# Patient Record
Sex: Female | Born: 1960 | Race: Black or African American | Hispanic: No | Marital: Married | Smoking: Never smoker
Health system: Southern US, Community
[De-identification: ages and names within clinical notes are randomized; demographics above are authoritative.]

## PROBLEM LIST (undated history)

## (undated) DIAGNOSIS — R809 Proteinuria, unspecified: Secondary | ICD-10-CM

## (undated) HISTORY — PX: SALPINGECTOMY: SHX328

## (undated) HISTORY — PX: KIDNEY SURGERY: SHX687

## (undated) HISTORY — PX: ANKLE FRACTURE SURGERY: SHX122

---

## 1999-07-26 ENCOUNTER — Encounter: Payer: Self-pay | Admitting: Family Medicine

## 1999-07-26 ENCOUNTER — Ambulatory Visit (HOSPITAL_COMMUNITY): Admission: RE | Admit: 1999-07-26 | Discharge: 1999-07-26 | Payer: Self-pay | Admitting: *Deleted

## 2000-08-14 ENCOUNTER — Other Ambulatory Visit: Admission: RE | Admit: 2000-08-14 | Discharge: 2000-08-14 | Payer: Self-pay | Admitting: Family Medicine

## 2003-04-08 ENCOUNTER — Encounter: Payer: Self-pay | Admitting: Family Medicine

## 2003-04-08 ENCOUNTER — Ambulatory Visit (HOSPITAL_COMMUNITY): Admission: RE | Admit: 2003-04-08 | Discharge: 2003-04-08 | Payer: Self-pay | Admitting: Family Medicine

## 2004-09-07 ENCOUNTER — Other Ambulatory Visit: Admission: RE | Admit: 2004-09-07 | Discharge: 2004-09-07 | Payer: Self-pay | Admitting: Family Medicine

## 2004-09-23 ENCOUNTER — Ambulatory Visit (HOSPITAL_COMMUNITY): Admission: RE | Admit: 2004-09-23 | Discharge: 2004-09-23 | Payer: Self-pay | Admitting: Family Medicine

## 2008-05-28 ENCOUNTER — Other Ambulatory Visit: Admission: RE | Admit: 2008-05-28 | Discharge: 2008-05-28 | Payer: Self-pay | Admitting: Family Medicine

## 2009-01-29 ENCOUNTER — Encounter: Admission: RE | Admit: 2009-01-29 | Discharge: 2009-01-29 | Payer: Self-pay | Admitting: Cardiology

## 2009-03-17 ENCOUNTER — Encounter: Admission: RE | Admit: 2009-03-17 | Discharge: 2009-03-17 | Payer: Self-pay | Admitting: Family Medicine

## 2011-04-25 ENCOUNTER — Other Ambulatory Visit (HOSPITAL_COMMUNITY)
Admission: RE | Admit: 2011-04-25 | Discharge: 2011-04-25 | Disposition: A | Discharge status: Home / Self Care | Payer: BC Managed Care – PPO | Source: Ambulatory Visit | Attending: Family Medicine | Admitting: Family Medicine

## 2011-04-25 ENCOUNTER — Other Ambulatory Visit: Payer: Self-pay | Admitting: Family Medicine

## 2011-04-25 DIAGNOSIS — Z0000 Encounter for general adult medical examination without abnormal findings: Secondary | ICD-10-CM | POA: Insufficient documentation

## 2011-04-26 ENCOUNTER — Other Ambulatory Visit: Payer: Self-pay | Admitting: Nephrology

## 2011-04-26 DIAGNOSIS — R809 Proteinuria, unspecified: Secondary | ICD-10-CM

## 2011-04-27 ENCOUNTER — Ambulatory Visit
Admission: RE | Admit: 2011-04-27 | Discharge: 2011-04-27 | Disposition: A | Discharge status: Home / Self Care | Payer: BC Managed Care – PPO | Source: Ambulatory Visit | Attending: Nephrology | Admitting: Nephrology

## 2011-04-27 DIAGNOSIS — R809 Proteinuria, unspecified: Secondary | ICD-10-CM

## 2011-04-28 ENCOUNTER — Other Ambulatory Visit: Payer: Self-pay | Admitting: Family Medicine

## 2011-04-28 DIAGNOSIS — R1310 Dysphagia, unspecified: Secondary | ICD-10-CM

## 2011-05-02 ENCOUNTER — Ambulatory Visit
Admission: RE | Admit: 2011-05-02 | Discharge: 2011-05-02 | Disposition: A | Discharge status: Home / Self Care | Payer: BC Managed Care – PPO | Source: Ambulatory Visit | Attending: Family Medicine | Admitting: Family Medicine

## 2011-05-02 DIAGNOSIS — R1310 Dysphagia, unspecified: Secondary | ICD-10-CM

## 2011-11-22 ENCOUNTER — Other Ambulatory Visit (HOSPITAL_COMMUNITY): Payer: Self-pay | Admitting: Otolaryngology

## 2011-11-24 ENCOUNTER — Ambulatory Visit (HOSPITAL_COMMUNITY)
Admission: RE | Admit: 2011-11-24 | Discharge: 2011-11-24 | Disposition: A | Discharge status: Home / Self Care | Payer: BC Managed Care – PPO | Source: Ambulatory Visit | Attending: Otolaryngology | Admitting: Otolaryngology

## 2011-11-24 DIAGNOSIS — R1310 Dysphagia, unspecified: Secondary | ICD-10-CM | POA: Insufficient documentation

## 2011-11-24 NOTE — Procedures (Signed)
Modified Barium Swallow Procedure Note Patient Details  Name: Madeline Patterson MRN: [id number] Date of Birth: 1961/01/07  Today's Date: 11/24/2011 Time:  -     Past Medical History: No past medical history on file. Past Surgical History: No past surgical history on file. HPI:     Recommendation/Prognosis  Clinical Impression Dysphagia Diagnosis: Within Functional Limits Clinical impression: Pt presents with oral and oropharyngeal swallow WFL.  No penetration, aspiraiton or residue observed.  Pt with appearance of mildly slow esophageal transit of solids and liquids.  No backflow to pharynx observed.  Pt to have barium swallow today to further address esophageal function.  Pt may continue a regular thin diet, no further recommendations, will defer to referring MD.  Recommendations Solid Consistency: Regular Liquid Consistency: Thin Liquid Administration via: Straw;Cup Medication Administration: Whole meds with liquid Supervision: Patient able to self feed Postural Changes and/or Swallow Maneuvers: Seated upright 90 degrees;Upright 30-60 min after meal Oral Care Recommendations: Patient independent with oral care   Individuals Consulted Consulted and Agree with Results and Recommendations: Patient Report Sent to : Referring physician   General:  Date of Onset: 11/23/10 Other Pertinent Information: Pt reports gagging, desire to expectorate, globus, burning sensation in throat with POs and in bed.  Has been treated by ENT, has tried medications for acid reflux but reports "they stop working after a few weeks so i stop taking them." Pt denies other medical problems.  Type of Study: Initial MBS Diet Prior to this Study: Regular;Thin liquids Respiratory Status: Room air Behavior/Cognition: Alert;Cooperative;Pleasant mood Oral Cavity - Dentition: Adequate natural dentition Oral Motor / Sensory Function: Within functional limits Patient Positioning: Upright in chair Baseline Vocal  Quality: Normal Volitional Cough: Strong Volitional Swallow: Able to elicit Anatomy: Within functional limits Pharyngeal Secretions: Not observed secondary MBS Ice chips: Not tested   Oral Phase Oral Preparation/Oral Phase Oral Phase: WFL Pharyngeal Phase  Pharyngeal Phase Pharyngeal Phase: Within functional limits Cervical Esophageal Phase  Cervical Esophageal Phase Cervical Esophageal Phase: Impaired Cervical Esophageal Phase - Thin Thin Cup: Other (Comment) (Appearance of slow transit of thin liquids through LES) Cervical Esophageal Phase - Solids Puree:  (Appearance of stasis throughout esophagus for 10 seconds )   Harlon Ditty, MA CCC-SLP [phone]   Claudine Mouton 11/24/2011, 2:09 PM

## 2013-04-30 ENCOUNTER — Other Ambulatory Visit: Payer: Self-pay | Admitting: Family Medicine

## 2013-04-30 ENCOUNTER — Other Ambulatory Visit (HOSPITAL_COMMUNITY)
Admission: RE | Admit: 2013-04-30 | Discharge: 2013-04-30 | Disposition: A | Discharge status: Home / Self Care | Payer: BC Managed Care – PPO | Source: Ambulatory Visit | Attending: Family Medicine | Admitting: Family Medicine

## 2013-04-30 DIAGNOSIS — Z0000 Encounter for general adult medical examination without abnormal findings: Secondary | ICD-10-CM | POA: Insufficient documentation

## 2014-10-24 ENCOUNTER — Other Ambulatory Visit: Payer: Self-pay | Admitting: Otolaryngology

## 2014-10-24 ENCOUNTER — Ambulatory Visit
Admission: RE | Admit: 2014-10-24 | Discharge: 2014-10-24 | Disposition: A | Discharge status: Home / Self Care | Payer: BC Managed Care – PPO | Source: Ambulatory Visit | Attending: Otolaryngology | Admitting: Otolaryngology

## 2014-10-24 DIAGNOSIS — J019 Acute sinusitis, unspecified: Secondary | ICD-10-CM

## 2015-05-04 ENCOUNTER — Other Ambulatory Visit: Payer: Self-pay | Admitting: Internal Medicine

## 2016-06-06 ENCOUNTER — Other Ambulatory Visit (HOSPITAL_COMMUNITY): Payer: Self-pay | Admitting: Family

## 2016-06-09 ENCOUNTER — Encounter (HOSPITAL_COMMUNITY): Payer: Self-pay | Admitting: *Deleted

## 2016-06-09 MED ORDER — CEFAZOLIN SODIUM-DEXTROSE 2-4 GM/100ML-% IV SOLN
2.0000 g | INTRAVENOUS | Status: AC
  Administered 2016-06-10: 2 g via INTRAVENOUS
  Filled 2016-06-09: qty 100

## 2016-06-10 ENCOUNTER — Encounter (HOSPITAL_COMMUNITY): Payer: Self-pay | Admitting: *Deleted

## 2016-06-10 ENCOUNTER — Ambulatory Visit (HOSPITAL_COMMUNITY): Payer: BC Managed Care – PPO | Admitting: Certified Registered Nurse Anesthetist

## 2016-06-10 ENCOUNTER — Ambulatory Visit (HOSPITAL_COMMUNITY)
Admission: RE | Admit: 2016-06-10 | Discharge: 2016-06-10 | Disposition: A | Discharge status: Home / Self Care | Payer: BC Managed Care – PPO | Source: Ambulatory Visit | Attending: Orthopedic Surgery | Admitting: Orthopedic Surgery

## 2016-06-10 ENCOUNTER — Encounter (HOSPITAL_COMMUNITY)
Admission: RE | Disposition: A | Discharge status: Home / Self Care | Payer: Self-pay | Source: Ambulatory Visit | Attending: Orthopedic Surgery

## 2016-06-10 DIAGNOSIS — S92352A Displaced fracture of fifth metatarsal bone, left foot, initial encounter for closed fracture: Secondary | ICD-10-CM | POA: Diagnosis present

## 2016-06-10 DIAGNOSIS — Z6841 Body Mass Index (BMI) 40.0 and over, adult: Secondary | ICD-10-CM | POA: Insufficient documentation

## 2016-06-10 DIAGNOSIS — X58XXXA Exposure to other specified factors, initial encounter: Secondary | ICD-10-CM | POA: Diagnosis not present

## 2016-06-10 DIAGNOSIS — S92302S Fracture of unspecified metatarsal bone(s), left foot, sequela: Secondary | ICD-10-CM

## 2016-06-10 HISTORY — DX: Proteinuria, unspecified: R80.9

## 2016-06-10 HISTORY — PX: ORIF TOE FRACTURE: SHX5032

## 2016-06-10 LAB — BASIC METABOLIC PANEL
ANION GAP: 5 (ref 5–15)
BUN: 17 mg/dL (ref 6–20)
CHLORIDE: 107 mmol/L (ref 101–111)
CO2: 26 mmol/L (ref 22–32)
Calcium: 9.1 mg/dL (ref 8.9–10.3)
Creatinine, Ser: 0.91 mg/dL (ref 0.44–1.00)
GFR calc Af Amer: >60 mL/min (ref >60)
GLUCOSE: 91 mg/dL (ref 65–99)
POTASSIUM: 4.1 mmol/L (ref 3.5–5.1)
Sodium: 138 mmol/L (ref 135–145)

## 2016-06-10 LAB — CBC
HCT: 39.6 % (ref 36.0–46.0)
HEMOGLOBIN: 12.3 g/dL (ref 12.0–15.0)
MCH: 25.3 pg — AB (ref 26.0–34.0)
MCHC: 31.1 g/dL (ref 30.0–36.0)
MCV: 81.3 fL (ref 78.0–100.0)
Platelets: 228 10*3/uL (ref 150–400)
RBC: 4.87 MIL/uL (ref 3.87–5.11)
RDW: 13.3 % (ref 11.5–15.5)
WBC: 2.9 10*3/uL — AB (ref 4.0–10.5)

## 2016-06-10 SURGERY — OPEN REDUCTION INTERNAL FIXATION (ORIF) METATARSAL (TOE) FRACTURE
Anesthesia: General | Laterality: Left

## 2016-06-10 MED ORDER — KETOROLAC TROMETHAMINE 30 MG/ML IJ SOLN
INTRAMUSCULAR | Status: AC
  Filled 2016-06-10: qty 1

## 2016-06-10 MED ORDER — HYDROCODONE-ACETAMINOPHEN 5-325 MG PO TABS: 1.0000 | ORAL_TABLET | Freq: Four times a day (QID) | ORAL | Status: AC | PRN

## 2016-06-10 MED ORDER — FENTANYL CITRATE (PF) 100 MCG/2ML IJ SOLN
INTRAMUSCULAR | Status: DC | PRN
  Administered 2016-06-10 (×2): 50 ug via INTRAVENOUS

## 2016-06-10 MED ORDER — ONDANSETRON HCL 4 MG/2ML IJ SOLN: 4.0000 mg | Freq: Once | INTRAMUSCULAR | Status: DC | PRN

## 2016-06-10 MED ORDER — KETOROLAC TROMETHAMINE 30 MG/ML IJ SOLN
30.0000 mg | Freq: Once | INTRAMUSCULAR | Status: AC
  Administered 2016-06-10: 30 mg via INTRAVENOUS

## 2016-06-10 MED ORDER — FENTANYL CITRATE (PF) 100 MCG/2ML IJ SOLN
25.0000 ug | INTRAMUSCULAR | Status: DC | PRN
  Administered 2016-06-10 (×2): 50 ug via INTRAVENOUS

## 2016-06-10 MED ORDER — MEPERIDINE HCL 25 MG/ML IJ SOLN: 6.2500 mg | INTRAMUSCULAR | Status: DC | PRN

## 2016-06-10 MED ORDER — IBUPROFEN 200 MG PO TABS
200.0000 mg | ORAL_TABLET | Freq: Four times a day (QID) | ORAL | Status: DC | PRN
  Filled 2016-06-10: qty 2

## 2016-06-10 MED ORDER — LIDOCAINE HCL (CARDIAC) 20 MG/ML IV SOLN
INTRAVENOUS | Status: DC | PRN
  Administered 2016-06-10: 100 mg via INTRAVENOUS

## 2016-06-10 MED ORDER — PROPOFOL 10 MG/ML IV BOLUS
INTRAVENOUS | Status: AC
  Filled 2016-06-10: qty 20

## 2016-06-10 MED ORDER — LACTATED RINGERS IV SOLN
INTRAVENOUS | Status: DC
  Administered 2016-06-10 (×2): via INTRAVENOUS

## 2016-06-10 MED ORDER — MIDAZOLAM HCL 5 MG/5ML IJ SOLN
INTRAMUSCULAR | Status: DC | PRN
  Administered 2016-06-10: 2 mg via INTRAVENOUS

## 2016-06-10 MED ORDER — FENTANYL CITRATE (PF) 250 MCG/5ML IJ SOLN
INTRAMUSCULAR | Status: AC
  Filled 2016-06-10: qty 5

## 2016-06-10 MED ORDER — PROPOFOL 10 MG/ML IV BOLUS
INTRAVENOUS | Status: DC | PRN
  Administered 2016-06-10: 40 mg via INTRAVENOUS
  Administered 2016-06-10: 200 mg via INTRAVENOUS

## 2016-06-10 MED ORDER — OXYCODONE HCL 5 MG/5ML PO SOLN
5.0000 mg | Freq: Once | ORAL | Status: DC | PRN
Start: 2016-06-10 — End: 2016-06-10

## 2016-06-10 MED ORDER — CHLORHEXIDINE GLUCONATE 4 % EX LIQD: 60.0000 mL | Freq: Once | CUTANEOUS | Status: DC

## 2016-06-10 MED ORDER — ONDANSETRON HCL 4 MG/2ML IJ SOLN
INTRAMUSCULAR | Status: DC | PRN
  Administered 2016-06-10: 4 mg via INTRAVENOUS

## 2016-06-10 MED ORDER — FENTANYL CITRATE (PF) 100 MCG/2ML IJ SOLN
INTRAMUSCULAR | Status: DC
Start: 2016-06-10 — End: 2016-06-10
  Filled 2016-06-10: qty 2

## 2016-06-10 MED ORDER — OXYCODONE HCL 5 MG PO TABS: 5.0000 mg | ORAL_TABLET | Freq: Once | ORAL | Status: DC | PRN

## 2016-06-10 MED ORDER — OXYCODONE HCL 5 MG PO TABS
ORAL_TABLET | ORAL | Status: AC
  Administered 2016-06-10: 5 mg
  Filled 2016-06-10: qty 1

## 2016-06-10 MED ORDER — LIDOCAINE 2% (20 MG/ML) 5 ML SYRINGE
INTRAMUSCULAR | Status: AC
  Filled 2016-06-10: qty 5

## 2016-06-10 MED ORDER — IBUPROFEN 100 MG/5ML PO SUSP
200.0000 mg | Freq: Four times a day (QID) | ORAL | Status: DC | PRN
Start: 2016-06-10 — End: 2016-06-10
  Filled 2016-06-10: qty 20

## 2016-06-10 MED ORDER — 0.9 % SODIUM CHLORIDE (POUR BTL) OPTIME
TOPICAL | Status: DC | PRN
  Administered 2016-06-10: 1000 mL

## 2016-06-10 MED ORDER — KETOROLAC TROMETHAMINE 30 MG/ML IJ SOLN
INTRAMUSCULAR | Status: AC
  Administered 2016-06-10: 30 mg
  Filled 2016-06-10: qty 1

## 2016-06-10 MED ORDER — MIDAZOLAM HCL 2 MG/2ML IJ SOLN
INTRAMUSCULAR | Status: AC
  Filled 2016-06-10: qty 2

## 2016-06-10 SURGICAL SUPPLY — 60 items
BIT DRILL CAN 3.5MM (BIT) ×1
BIT DRILL CANN 3.5 (BIT) ×2
BIT DRILL SRG 3.5XCAN FFTH MTR (BIT) ×1 IMPLANT
BIT DRL SRG 3.5XCANN FIFTH MTR (BIT) ×1
BLADE AVERAGE 25MMX9MM (BLADE)
BLADE AVERAGE 25X9 (BLADE) IMPLANT
BLADE MINI RND TIP GREEN BEAV (BLADE) IMPLANT
BNDG CMPR 9X4 STRL LF SNTH (GAUZE/BANDAGES/DRESSINGS) ×1
BNDG COHESIVE 1X5 TAN STRL LF (GAUZE/BANDAGES/DRESSINGS) IMPLANT
BNDG COHESIVE 4X5 TAN STRL (GAUZE/BANDAGES/DRESSINGS) ×3 IMPLANT
BNDG COHESIVE 6X5 TAN STRL LF (GAUZE/BANDAGES/DRESSINGS) IMPLANT
BNDG ESMARK 4X9 LF (GAUZE/BANDAGES/DRESSINGS) ×3 IMPLANT
BNDG GAUZE STRTCH 6 (GAUZE/BANDAGES/DRESSINGS) IMPLANT
CORDS BIPOLAR (ELECTRODE) ×3 IMPLANT
COTTON STERILE ROLL (GAUZE/BANDAGES/DRESSINGS) IMPLANT
COVER SURGICAL LIGHT HANDLE (MISCELLANEOUS) ×3 IMPLANT
CUFF TOURNIQUET SINGLE 18IN (TOURNIQUET CUFF) IMPLANT
CUFF TOURNIQUET SINGLE 24IN (TOURNIQUET CUFF) IMPLANT
CUFF TOURNIQUET SINGLE 34IN LL (TOURNIQUET CUFF) IMPLANT
CUFF TOURNIQUET SINGLE 44IN (TOURNIQUET CUFF) IMPLANT
DRAPE OEC MINIVIEW 54X84 (DRAPES) ×3 IMPLANT
DRAPE U-SHAPE 47X51 STRL (DRAPES) ×3 IMPLANT
DRSG ADAPTIC 3X8 NADH LF (GAUZE/BANDAGES/DRESSINGS) ×3 IMPLANT
DURAPREP 26ML APPLICATOR (WOUND CARE) ×3 IMPLANT
ELECT REM PT RETURN 9FT ADLT (ELECTROSURGICAL) ×3
ELECTRODE REM PT RTRN 9FT ADLT (ELECTROSURGICAL) ×1 IMPLANT
GAUZE SPONGE 2X2 8PLY STRL LF (GAUZE/BANDAGES/DRESSINGS) IMPLANT
GAUZE SPONGE 4X4 12PLY STRL (GAUZE/BANDAGES/DRESSINGS) IMPLANT
GLOVE BIOGEL PI IND STRL 9 (GLOVE) ×1 IMPLANT
GLOVE BIOGEL PI INDICATOR 9 (GLOVE) ×2
GLOVE SURG ORTHO 9.0 STRL STRW (GLOVE) ×3 IMPLANT
GOWN STRL REUS W/ TWL XL LVL3 (GOWN DISPOSABLE) ×2 IMPLANT
GOWN STRL REUS W/TWL XL LVL3 (GOWN DISPOSABLE) ×4
GUIDEWIRE .07X8 (WIRE) ×3 IMPLANT
KIT BASIN OR (CUSTOM PROCEDURE TRAY) ×3 IMPLANT
KIT ROOM TURNOVER OR (KITS) ×3 IMPLANT
MANIFOLD NEPTUNE II (INSTRUMENTS) ×3 IMPLANT
NEEDLE HYPO 25GX1X1/2 BEV (NEEDLE) IMPLANT
NS IRRIG 1000ML POUR BTL (IV SOLUTION) ×3 IMPLANT
PACK ORTHO EXTREMITY (CUSTOM PROCEDURE TRAY) ×3 IMPLANT
PAD ARMBOARD 7.5X6 YLW CONV (MISCELLANEOUS) ×6 IMPLANT
PAD CAST 4YDX4 CTTN HI CHSV (CAST SUPPLIES) IMPLANT
PADDING CAST ABS 4INX4YD NS (CAST SUPPLIES) ×2
PADDING CAST ABS COTTON 4X4 ST (CAST SUPPLIES) ×1 IMPLANT
PADDING CAST COTTON 4X4 STRL (CAST SUPPLIES)
SCREW 5.5MMX45MM (Screw) ×3 IMPLANT
SPECIMEN JAR SMALL (MISCELLANEOUS) IMPLANT
SPONGE GAUZE 2X2 STER 10/PKG (GAUZE/BANDAGES/DRESSINGS)
SPONGE GAUZE 4X4 12PLY STER LF (GAUZE/BANDAGES/DRESSINGS) ×3 IMPLANT
SUCTION FRAZIER HANDLE 10FR (MISCELLANEOUS)
SUCTION TUBE FRAZIER 10FR DISP (MISCELLANEOUS) IMPLANT
SUT ETHILON 2 0 PSLX (SUTURE) ×6 IMPLANT
SUT VIC AB 2-0 CT1 27 (SUTURE) ×3
SUT VIC AB 2-0 CT1 TAPERPNT 27 (SUTURE) ×1 IMPLANT
SYR CONTROL 10ML LL (SYRINGE) IMPLANT
TOWEL OR 17X24 6PK STRL BLUE (TOWEL DISPOSABLE) ×3 IMPLANT
TOWEL OR 17X26 10 PK STRL BLUE (TOWEL DISPOSABLE) ×3 IMPLANT
TUBE CONNECTING 12'X1/4 (SUCTIONS)
TUBE CONNECTING 12X1/4 (SUCTIONS) IMPLANT
WATER STERILE IRR 1000ML POUR (IV SOLUTION) IMPLANT

## 2016-06-10 NOTE — Transfer of Care (Signed)
Immediate Anesthesia Transfer of Care Note  Patient: Madeline Patterson  Procedure(s) Performed: Procedure(s): Internal Fixation Left 5th Metatarsal (Left)  Patient Location: PACU  Anesthesia Type:General  Level of Consciousness: awake, alert  and oriented  Airway & Oxygen Therapy: Patient Spontanous Breathing and Patient connected to nasal cannula oxygen  Post-op Assessment: Report given to RN, Post -op Vital signs reviewed and stable and Patient moving all extremities X 4  Post vital signs: Reviewed and stable  Last Vitals:  Filed Vitals:   06/10/16 0826  BP: 155/94  Pulse: 60  Temp: 36.8 C  Resp: 20    Last Pain: There were no vitals filed for this visit.       Complications: No apparent anesthesia complications

## 2016-06-10 NOTE — Op Note (Signed)
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    The risks benefits and alternatives were discussed with the patient preoperatively including but not limited to the risks of infection, bleeding, nerve injury, cardiopulmonary complications, the need for revision surgery, among others, and the patient was willing to proceed.  OPERATIVE IMPLANTS: Arthrex 5.5 x 45 mm screw  OPERATIVE FINDINGS: Radiographs show stable alignment  OPERATIVE PROCEDURE: Patient brought the operating room and underwent a general anesthetic. After adequate levels anesthesia obtained patient's left lower extremity was prepped using DuraPrep draped into a sterile field a timeout was called. An incision was made just proximal to the base of the fifth metatarsal. A guidewire was inserted the center centered down the shaft of the metatarsal C-arm fluoroscopy verified alignment. This was then overdrilled the 5 and then 5.5 tap was used a 45 mm screws inserted this had a good compression fit. Patient was irrigated with normal saline incision closed with 2-0 nylon a sterile compressive dressing was applied patient was extubated taken the PACU in stable condition plan for discharge to home.

## 2016-06-10 NOTE — Anesthesia Preprocedure Evaluation (Signed)
Anesthesia Evaluation  Patient identified by MRN, date of birth, ID band Patient awake    Reviewed: Allergy & Precautions, H&P , NPO status , Patient's Chart, lab work & pertinent test results  Airway Mallampati: I  TM Distance: >3 FB Neck ROM: full    Dental no notable dental hx. (+) Teeth Intact   Pulmonary neg pulmonary ROS,    Pulmonary exam normal breath sounds clear to auscultation       Cardiovascular negative cardio ROS Normal cardiovascular exam     Neuro/Psych negative neurological ROS  negative psych ROS   GI/Hepatic negative GI ROS, Neg liver ROS,   Endo/Other  Morbid obesity  Renal/GU negative Renal ROS     Musculoskeletal   Abdominal (+) + obese,   Peds  Hematology negative hematology ROS (+)   Anesthesia Other Findings   Reproductive/Obstetrics negative OB ROS                             Anesthesia Physical Anesthesia Plan  ASA: III  Anesthesia Plan: General   Post-op Pain Management:    Induction: Intravenous  Airway Management Planned: LMA  Additional Equipment:   Intra-op Plan:   Post-operative Plan:   Informed Consent: I have reviewed the patients History and Physical, chart, labs and discussed the procedure including the risks, benefits and alternatives for the proposed anesthesia with the patient or authorized representative who has indicated his/her understanding and acceptance.     Plan Discussed with: CRNA and Surgeon  Anesthesia Plan Comments:         Anesthesia Quick Evaluation

## 2016-06-10 NOTE — Anesthesia Procedure Notes (Signed)
Procedure Name: LMA Insertion Date/Time: 06/10/2016 11:07 AM Performed by: Kelly SplinterHAWKINS, Semiyah Newgent B Pre-anesthesia Checklist: Patient identified, Emergency Drugs available, Suction available and Patient being monitored Patient Re-evaluated:Patient Re-evaluated prior to inductionOxygen Delivery Method: Circle System Utilized Preoxygenation: Pre-oxygenation with 100% oxygen Intubation Type: IV induction LMA: LMA inserted LMA Size: 5.0 Number of attempts: 1 Placement Confirmation: positive ETCO2 Tube secured with: Tape Dental Injury: Teeth and Oropharynx as per pre-operative assessment

## 2016-06-10 NOTE — Progress Notes (Signed)
Orthopedic Tech Progress Note Patient Details:  Rubbie BattiestSherry Nicholls 04/08/1961 [id number]  Ortho Devices Type of Ortho Device: Postop shoe/boot Ortho Device/Splint Location: lle Ortho Device/Splint Interventions: Application   Elias Bordner 06/10/2016, 12:52 PM

## 2016-06-10 NOTE — H&P (Signed)
Rubbie BattiestSherry Marsee is an 55 y.o. female.   Chief Complaint: Painful nonunion fracture base of the fifth metatarsal left foot HPI: Patient is a 55 year old woman with no known injury who has had persistent foot pain for several months radiographs confirm a nonunion of the fracture of the base of the fifth metatarsal.  Past Medical History  Diagnosis Date  . Protein in urine     has been cleared by Nephrologist    Past Surgical History  Procedure Laterality Date  . Ankle fracture surgery Left 2006- approx  . Salpingectomy Left   . Kidney surgery Left     "diseased part removed" age 89    History reviewed. No pertinent family history. Social History:  reports that she has never smoked. She does not have any smokeless tobacco history on file. She reports that she does not drink alcohol or use illicit drugs.  Allergies:  Allergies  Allergen Reactions  . Penicillins Itching and Other (See Comments)    Has patient had a PCN reaction causing immediate rash, facial/tongue/throat swelling, SOB or lightheadedness with hypotension: no Has patient had a PCN reaction causing severe rash involving mucus membranes or skin necrosis: no Has patient had a PCN reaction that required hospitalization no Has patient had a PCN reaction occurring within the last 10 years: no If all of the above answers are "NO", then may proceed with Cephalosporin use.     No prescriptions prior to admission    No results found for this or any previous visit (from the past 48 hour(s)). No results found.  Review of Systems  All other systems reviewed and are negative.   Height 5\' 9"  (1.753 m), weight 135.626 kg (299 lb). Physical Exam  On examination patient's foot is neurovascularly intact she has good pulses her foot is plantar grade radiographs shows a nonunion fracture through the metaphysis of the fifth metatarsal base. 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  Plan: We'll plan for internal fixation of the left foot fifth metatarsal risk and benefits were discussed including infection neurovascular injury persistent pain DVT need for additional surgery. Patient states she understands wish to proceed at this time.  Nadara MustardUDA,MARCUS V, MD 06/10/2016, 6:43 AM

## 2016-06-10 NOTE — Anesthesia Postprocedure Evaluation (Signed)
Anesthesia Post Note  Patient: Rubbie BattiestSherry Minetti  Procedure(s) Performed: Procedure(s) (LRB): Internal Fixation Left 5th Metatarsal (Left)  Patient location during evaluation: PACU Anesthesia Type: General Level of consciousness: awake Pain management: pain level controlled Vital Signs Assessment: post-procedure vital signs reviewed and stable Respiratory status: spontaneous breathing Cardiovascular status: stable Postop Assessment: no signs of nausea or vomiting Anesthetic complications: no     Last Vitals:  Filed Vitals:   06/10/16 1305 06/10/16 1310  BP:  159/84  Pulse:  53  Temp: 36.5 C   Resp:  16    Last Pain:  Filed Vitals:   06/10/16 1312  PainSc: 0-No pain   Pain Goal:                 Jadie Comas JR,JOHN Dynisha Due

## 2016-06-10 NOTE — Progress Notes (Signed)
Report given to Wanda Cellucci rn as caregiver 

## 2016-06-13 ENCOUNTER — Encounter (HOSPITAL_COMMUNITY): Payer: Self-pay | Admitting: Orthopedic Surgery

## 2016-06-23 ENCOUNTER — Encounter (HOSPITAL_COMMUNITY): Payer: Self-pay | Admitting: Orthopedic Surgery

## 2016-11-11 ENCOUNTER — Other Ambulatory Visit: Payer: Self-pay | Admitting: Family Medicine

## 2016-11-11 DIAGNOSIS — R430 Anosmia: Secondary | ICD-10-CM

## 2016-11-28 ENCOUNTER — Inpatient Hospital Stay: Admission: RE | Admit: 2016-11-28 | Payer: BC Managed Care – PPO | Source: Ambulatory Visit

## 2016-12-13 ENCOUNTER — Ambulatory Visit
Admission: RE | Admit: 2016-12-13 | Discharge: 2016-12-13 | Disposition: A | Discharge status: Home / Self Care | Payer: BC Managed Care – PPO | Source: Ambulatory Visit | Attending: Family Medicine | Admitting: Family Medicine

## 2016-12-13 DIAGNOSIS — R430 Anosmia: Secondary | ICD-10-CM

## 2016-12-13 MED ORDER — GADOBENATE DIMEGLUMINE 529 MG/ML IV SOLN
20.0000 mL | Freq: Once | INTRAVENOUS | Status: AC | PRN
  Administered 2016-12-13: 20 mL via INTRAVENOUS

## 2016-12-23 ENCOUNTER — Other Ambulatory Visit: Payer: BC Managed Care – PPO

## 2017-09-18 ENCOUNTER — Other Ambulatory Visit: Payer: Self-pay | Admitting: Family Medicine

## 2017-09-18 ENCOUNTER — Other Ambulatory Visit (HOSPITAL_COMMUNITY)
Admission: RE | Admit: 2017-09-18 | Discharge: 2017-09-18 | Disposition: A | Discharge status: Home / Self Care | Payer: BC Managed Care – PPO | Source: Ambulatory Visit | Attending: Family Medicine | Admitting: Family Medicine

## 2017-09-18 DIAGNOSIS — Z124 Encounter for screening for malignant neoplasm of cervix: Secondary | ICD-10-CM | POA: Diagnosis not present

## 2017-09-21 LAB — CYTOLOGY - PAP
Diagnosis: NEGATIVE
HPV: NOT DETECTED

## 2017-12-20 ENCOUNTER — Other Ambulatory Visit: Payer: Self-pay | Admitting: Family Medicine

## 2017-12-20 DIAGNOSIS — M5442 Lumbago with sciatica, left side: Secondary | ICD-10-CM

## 2017-12-30 ENCOUNTER — Ambulatory Visit
Admission: RE | Admit: 2017-12-30 | Discharge: 2017-12-30 | Disposition: A | Discharge status: Home / Self Care | Payer: BC Managed Care – PPO | Source: Ambulatory Visit | Attending: Family Medicine | Admitting: Family Medicine

## 2017-12-30 DIAGNOSIS — M5442 Lumbago with sciatica, left side: Secondary | ICD-10-CM

## 2019-08-05 ENCOUNTER — Other Ambulatory Visit: Payer: Self-pay

## 2019-08-05 DIAGNOSIS — Z20822 Contact with and (suspected) exposure to covid-19: Secondary | ICD-10-CM

## 2019-08-06 LAB — NOVEL CORONAVIRUS, NAA: SARS-CoV-2, NAA: NOT DETECTED

## 2020-03-05 ENCOUNTER — Ambulatory Visit: Payer: BC Managed Care – PPO | Attending: Family

## 2020-03-05 DIAGNOSIS — Z23 Encounter for immunization: Secondary | ICD-10-CM

## 2020-03-05 NOTE — Progress Notes (Signed)
   Covid-19 Vaccination Clinic  Name:  Saralynn Langhorst    MRN: [id number] DOB: 1961-03-30  03/05/2020  Ms. Casella was observed post Covid-19 immunization for 15 minutes without incident. She was provided with Vaccine Information Sheet and instruction to access the V-Safe system.   Ms. Saperstein was instructed to call 911 with any severe reactions post vaccine: Marland Kitchen Difficulty breathing  . Swelling of face and throat  . A fast heartbeat  . A bad rash all over body  . Dizziness and weakness   Immunizations Administered    Name Date Dose VIS Date Route   Moderna COVID-19 Vaccine 03/05/2020 10:16 AM 0.5 mL 11/26/2019 Intramuscular   Manufacturer: Moderna   Lot: [id number]   NDC: 92426-834-19

## 2020-03-31 ENCOUNTER — Ambulatory Visit
Admission: RE | Admit: 2020-03-31 | Discharge: 2020-03-31 | Disposition: A | Discharge status: Home / Self Care | Payer: BC Managed Care – PPO | Source: Ambulatory Visit | Attending: Family Medicine | Admitting: Family Medicine

## 2020-03-31 ENCOUNTER — Other Ambulatory Visit: Payer: Self-pay | Admitting: Family Medicine

## 2020-03-31 DIAGNOSIS — M542 Cervicalgia: Secondary | ICD-10-CM

## 2020-04-07 ENCOUNTER — Ambulatory Visit: Payer: BC Managed Care – PPO | Attending: Family

## 2020-04-07 DIAGNOSIS — Z23 Encounter for immunization: Secondary | ICD-10-CM

## 2020-04-07 NOTE — Progress Notes (Signed)
   Covid-19 Vaccination Clinic  Name:  Madeline Patterson    MRN: [id number] DOB: May 27, 1961  04/07/2020  [name] was observed post Covid-19 immunization for 15 minutes without incident. She was provided with Vaccine Information Sheet and instruction to access the V-Safe system.   [name] was instructed to call 911 with any severe reactions post vaccine: Marland Kitchen Difficulty breathing  . Swelling of face and throat  . A fast heartbeat  . A bad rash all over body  . Dizziness and weakness   Immunizations Administered    Name Date Dose VIS Date Route   Moderna COVID-19 Vaccine 04/07/2020 10:53 AM 0.5 mL 11/26/2019 Intramuscular   Manufacturer: Moderna   Lot: [id number]   NDC: 39672-897-91

## 2020-10-15 ENCOUNTER — Other Ambulatory Visit: Payer: Self-pay

## 2020-10-15 ENCOUNTER — Encounter: Payer: Self-pay | Admitting: Orthopedic Surgery

## 2020-10-15 ENCOUNTER — Ambulatory Visit: Payer: BC Managed Care – PPO | Admitting: Orthopedic Surgery

## 2020-10-15 ENCOUNTER — Ambulatory Visit: Payer: Self-pay

## 2020-10-15 DIAGNOSIS — G8929 Other chronic pain: Secondary | ICD-10-CM

## 2020-10-15 DIAGNOSIS — M25561 Pain in right knee: Secondary | ICD-10-CM | POA: Diagnosis not present

## 2020-10-15 MED ORDER — LIDOCAINE HCL (PF) 1 % IJ SOLN
5.0000 mL | INTRAMUSCULAR | Status: AC | PRN
  Administered 2020-10-15: 5 mL

## 2020-10-15 MED ORDER — METHYLPREDNISOLONE ACETATE 40 MG/ML IJ SUSP
40.0000 mg | INTRAMUSCULAR | Status: AC | PRN
  Administered 2020-10-15: 40 mg via INTRA_ARTICULAR

## 2020-10-15 NOTE — Progress Notes (Signed)
Office Visit Note   Patient: Madeline Patterson           Date of Birth: 1961/11/05           MRN: [id number] Visit Date: 10/15/2020              Requested by: Laurann Montana, MD [phone] [address] [place],  [place] [address] PCP: Laurann Montana, MD  Chief Complaint  Patient presents with  . Right Knee - Pain      HPI: Patient is a 59 year old woman who complains of medial joint line pain in the right knee pain with activities of daily living pain with going up and down stairs.  No mechanical locking or catching.  Assessment & Plan: Visit Diagnoses:  1. Chronic pain of right knee     Plan: Right knee was injected patient was given instructions for close chain kinetic exercises.  Follow-Up Instructions: No follow-ups on file.   Ortho Exam  Patient is alert, oriented, no adenopathy, well-dressed, normal affect, normal respiratory effort. Examination patient has no effusion there is varus alignment to the right knee collaterals and cruciates are stable there is crepitation of the patellofemoral joint with range of motion.  Imaging: XR KNEE 3 VIEW RIGHT  Result Date: 10/15/2020 2 view radiographs of the right knee shows medial joint line narrowing no subcondylar cysts no bony spurs.  There is varus alignment.  No images are attached to the encounter.  Labs: No results found for: HGBA1C, ESRSEDRATE, CRP, LABURIC, REPTSTATUS, GRAMSTAIN, CULT, LABORGA   No results found for: ALBUMIN, PREALBUMIN, LABURIC  No results found for: MG No results found for: VD25OH  No results found for: PREALBUMIN CBC EXTENDED Latest Ref Rng & Units 06/10/2016  WBC 4.0 - 10.5 K/uL 2.9(L)  RBC 3.87 - 5.11 MIL/uL 4.87  HGB 12.0 - 15.0 g/dL 20.3  HCT 36 - 46 % 55.9  PLT 150 - 400 K/uL 228     There is no height or weight on file to calculate BMI.  Orders:  Orders Placed This Encounter  Procedures  . XR KNEE 3 VIEW RIGHT   No orders of the defined types were placed in this  encounter.    Procedures: Large Joint Inj: R knee on 10/15/2020 4:53 PM Indications: pain and diagnostic evaluation Details: 22 G 1.5 in needle, anteromedial approach  Arthrogram: No  Medications: 5 mL lidocaine (PF) 1 %; 40 mg methylPREDNISolone acetate 40 MG/ML Outcome: tolerated well, no immediate complications Procedure, treatment alternatives, risks and benefits explained, specific risks discussed. Consent was given by the patient. Immediately prior to procedure a time out was called to verify the correct patient, procedure, equipment, support staff and site/side marked as required. Patient was prepped and draped in the usual sterile fashion.      Clinical Data: No additional findings.  ROS:  All other systems negative, except as noted in the HPI. Review of Systems  Objective: Vital Signs: There were no vitals taken for this visit.  Specialty Comments:  No specialty comments available.  PMFS History: There are no problems to display for this patient.  Past Medical History:  Diagnosis Date  . Protein in urine    has been cleared by Nephrologist    History reviewed. No pertinent family history.  Past Surgical History:  Procedure Laterality Date  . ANKLE FRACTURE SURGERY Left 2006- approx  . KIDNEY SURGERY Left    "diseased part removed" age 42  . ORIF TOE FRACTURE Left 06/10/2016  Procedure: Internal Fixation Left 5th Metatarsal;  Surgeon: Nadara Mustard, MD;  Location: [facility] OR;  Service: Orthopedics;  Laterality: Left;  . SALPINGECTOMY Left    Social History   Occupational History  . Not on file  Tobacco Use  . Smoking status: Never Smoker  Substance and Sexual Activity  . Alcohol use: No  . Drug use: No  . Sexual activity: Not on file

## 2020-11-12 ENCOUNTER — Ambulatory Visit: Payer: BC Managed Care – PPO | Admitting: Orthopedic Surgery

## 2021-10-20 ENCOUNTER — Other Ambulatory Visit: Payer: Self-pay | Admitting: Family Medicine

## 2021-10-20 DIAGNOSIS — M7989 Other specified soft tissue disorders: Secondary | ICD-10-CM

## 2021-11-15 ENCOUNTER — Other Ambulatory Visit: Payer: BC Managed Care – PPO

## 2021-11-26 ENCOUNTER — Inpatient Hospital Stay: Admission: RE | Admit: 2021-11-26 | Payer: Self-pay | Source: Ambulatory Visit

## 2022-02-15 ENCOUNTER — Ambulatory Visit
Admission: RE | Admit: 2022-02-15 | Discharge: 2022-02-15 | Disposition: A | Discharge status: Home / Self Care | Payer: BC Managed Care – PPO | Source: Ambulatory Visit | Attending: Family Medicine | Admitting: Family Medicine

## 2022-02-15 DIAGNOSIS — M7989 Other specified soft tissue disorders: Secondary | ICD-10-CM

## 2022-03-16 IMAGING — US US EXTREM LOW*L* LIMITED
1 series · 10 of 10 positions shown · non-contrast
Comparison: None.

CLINICAL DATA: Palpable abnormality in the medial left calf,
initial encounter

EXAM:
ULTRASOUND LEFT LOWER EXTREMITY LIMITED
TECHNIQUE: Ultrasound examination of the lower extremity soft tissues was
performed in the area of clinical concern.

[Series 1: us extrem low*left* limited · 0.02mm/px · 10 acquisitions, 10 frames shown]
[im 1/10]
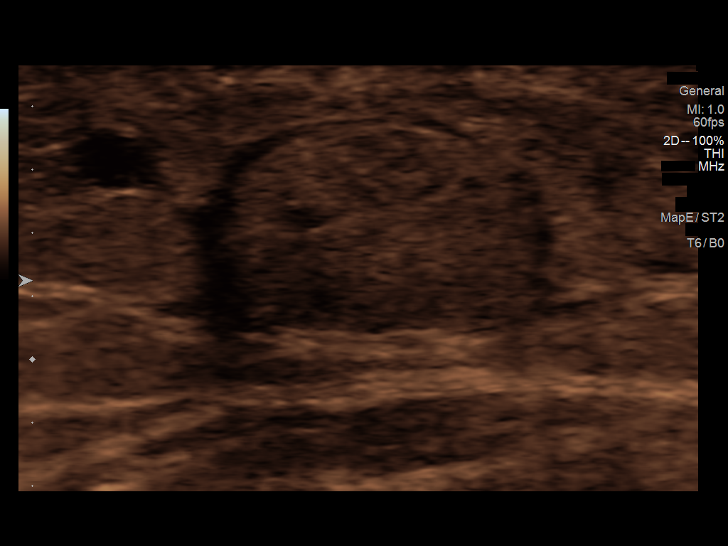
[im 2/10]
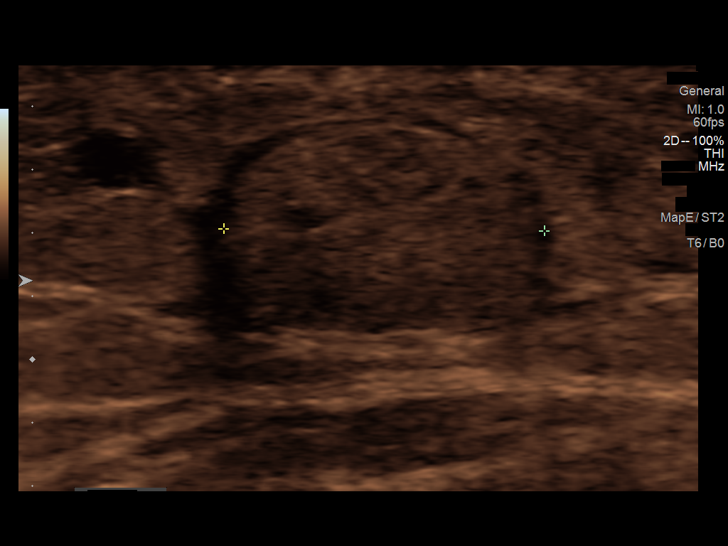
[im 3/10]
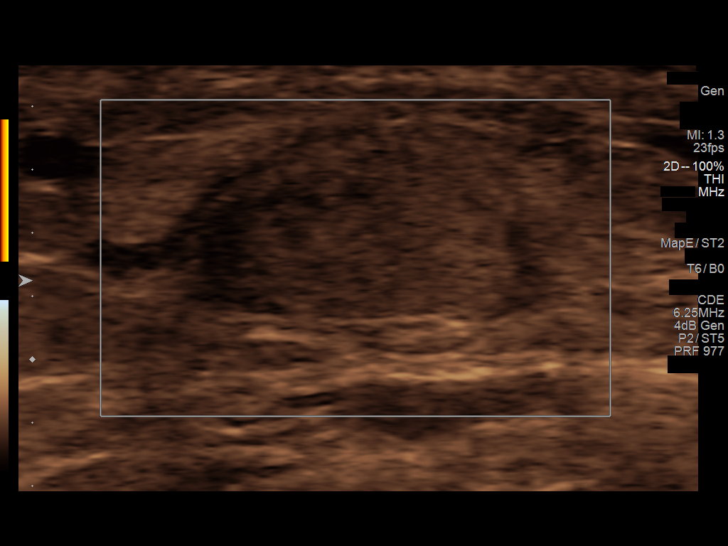
[im 4/10]
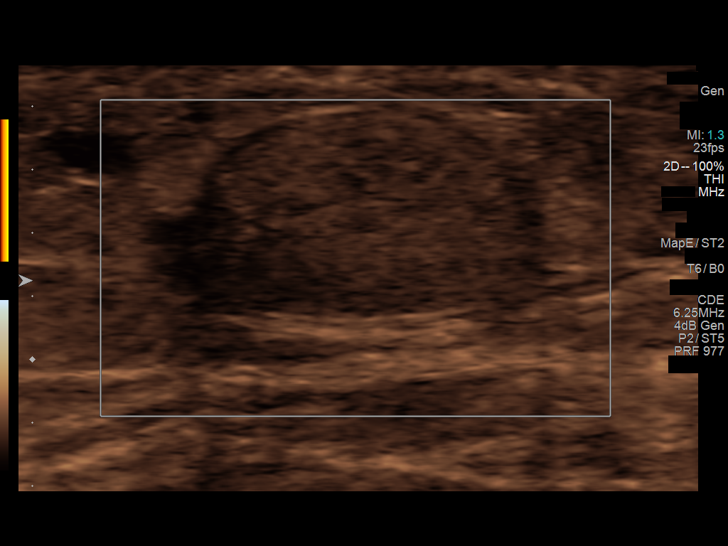
[im 5/10]
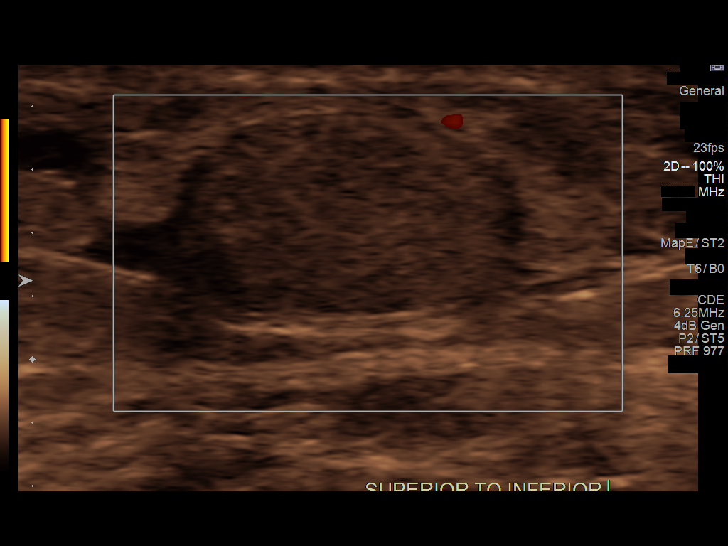
[im 6/10]
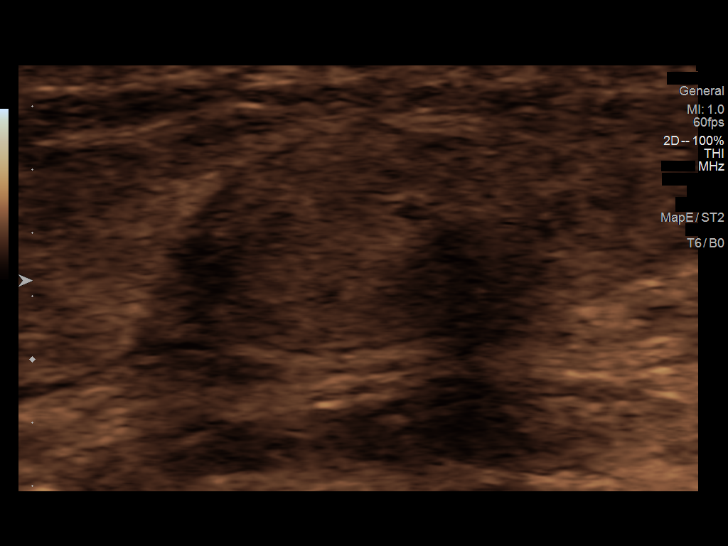
[im 7/10]
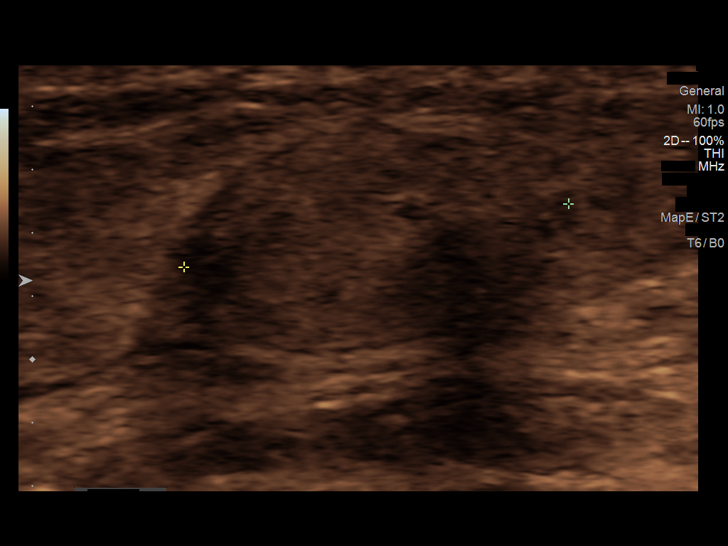
[im 8/10]
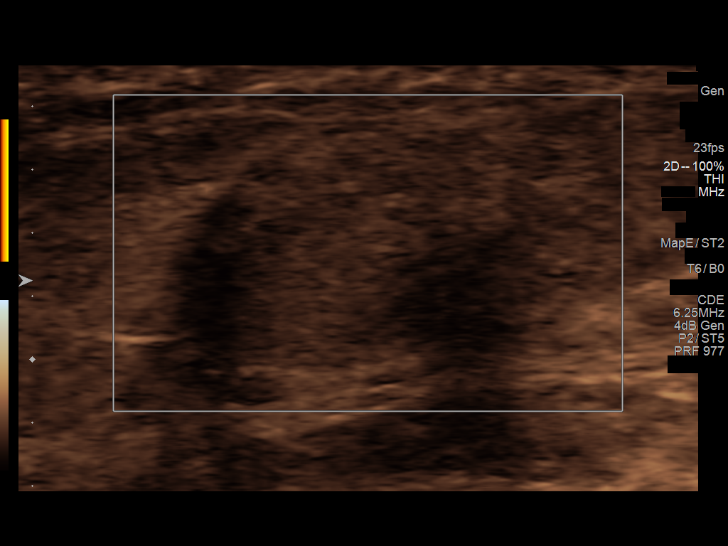
[im 9/10]
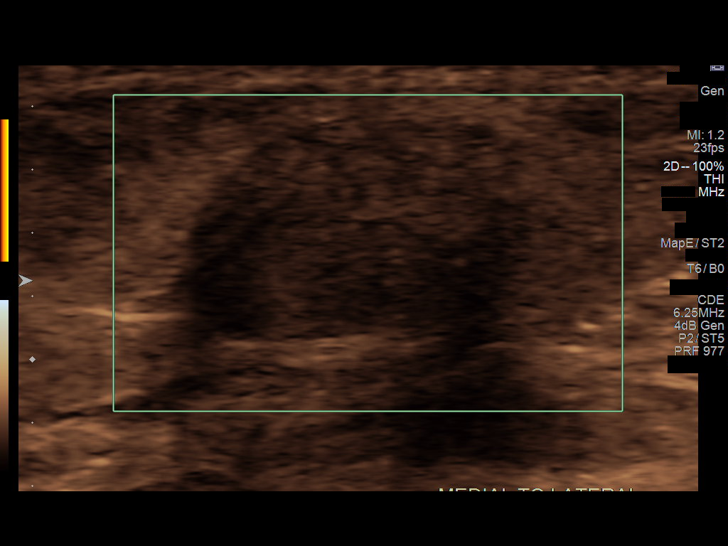
[im 10/10]
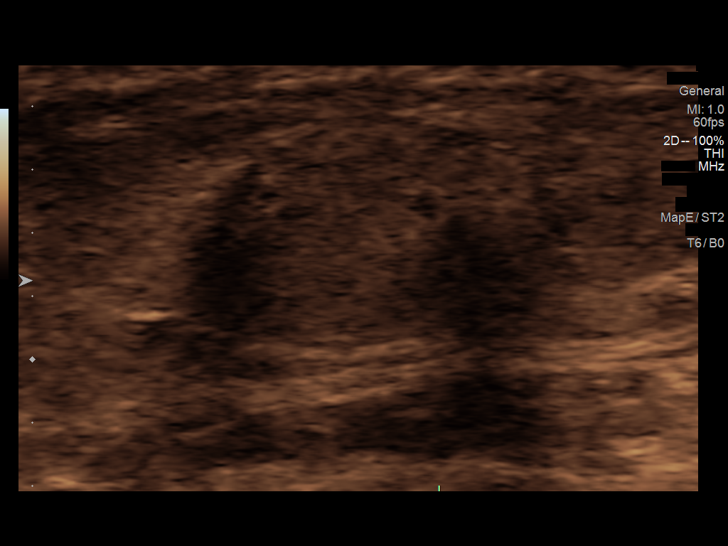

[10 of 10 positions shown; findings below may reference images not displayed]

FINDINGS: Scanning in the area of clinical concern shows a 1.2 x 0.7 x 1.1 cm
focal lesion in the subcutaneous fatty tissues. This may simply
represent a lipoma.
IMPRESSION: Likely subcutaneous lipoma. Clinical follow-up is recommended.
Further imaging could be performed if the lesion enlarges.

## 2024-03-12 ENCOUNTER — Ambulatory Visit: Payer: Self-pay | Admitting: Orthopedic Surgery

## 2024-03-12 ENCOUNTER — Other Ambulatory Visit (INDEPENDENT_AMBULATORY_CARE_PROVIDER_SITE_OTHER): Payer: Self-pay

## 2024-03-12 DIAGNOSIS — G8929 Other chronic pain: Secondary | ICD-10-CM

## 2024-03-12 DIAGNOSIS — M25562 Pain in left knee: Secondary | ICD-10-CM

## 2024-03-17 ENCOUNTER — Encounter: Payer: Self-pay | Admitting: Orthopedic Surgery

## 2024-03-17 DIAGNOSIS — G8929 Other chronic pain: Secondary | ICD-10-CM | POA: Diagnosis not present

## 2024-03-17 DIAGNOSIS — M25562 Pain in left knee: Secondary | ICD-10-CM

## 2024-03-17 MED ORDER — METHYLPREDNISOLONE ACETATE 40 MG/ML IJ SUSP
40.0000 mg | INTRAMUSCULAR | Status: AC | PRN
  Administered 2024-03-17: 40 mg via INTRA_ARTICULAR

## 2024-03-17 MED ORDER — LIDOCAINE HCL (PF) 1 % IJ SOLN
5.0000 mL | INTRAMUSCULAR | Status: AC | PRN
  Administered 2024-03-17: 5 mL

## 2024-03-17 NOTE — Progress Notes (Signed)
 Office Visit Note   Patient: Madeline Patterson           Date of Birth: [birth year]           MRN: [id number] Visit Date: 03/12/2024              Requested by: Laurann Montana, MD [phone] [address] [place],  [place] [address] PCP: Laurann Montana, MD  Chief Complaint  Patient presents with   Left Knee - Pain      HPI: Patient is a 63 year old woman who has been having increased pain in the left knee for the past 6 to 7 weeks.  Denies any specific injury.  Patient states she has a history of osteoarthritis with pain primarily over the medial joint line and decreased range of motion.  She states she does have start up stiffness.  Assessment & Plan: Visit Diagnoses:  1. Chronic pain of left knee     Plan: Left knee was injected she tolerated this well recommended weight loss.  Discussed the patient may require a total knee arthroplasty and we would have to get her BMI below 40 ideally to proceed with surgery.  Follow-Up Instructions: No follow-ups on file.   Ortho Exam  Patient is alert, oriented, no adenopathy, well-dressed, normal affect, normal respiratory effort. Examination patient has varus alignment of both knees.  There is crepitation with range of motion of the left knee collateral and cruciates are stable she is maximally tender to palpation over the medial joint line.  Imaging: No results found. No images are attached to the encounter.  Labs: No results found for: "HGBA1C", "ESRSEDRATE", "CRP", "LABURIC", "REPTSTATUS", "GRAMSTAIN", "CULT", "LABORGA"   No results found for: "ALBUMIN", "PREALBUMIN", "CBC"  No results found for: "MG" No results found for: "VD25OH"  No results found for: "PREALBUMIN"    Latest Ref Rng & Units 06/10/2016    8:55 AM  CBC EXTENDED  WBC 4.0 - 10.5 K/uL 2.9   RBC 3.87 - 5.11 MIL/uL 4.87   Hemoglobin 12.0 - 15.0 g/dL 78.2   HCT 95.6 - 21.3 % 39.6   Platelets 150 - 400 K/uL 228      There is no height or weight on file to  calculate BMI.  Orders:  Orders Placed This Encounter  Procedures   XR Knee 1-2 Views Left   No orders of the defined types were placed in this encounter.    Procedures: Large Joint Inj: L knee on 03/17/2024 11:10 AM Indications: pain and diagnostic evaluation Details: 22 G 1.5 in needle, anteromedial approach  Arthrogram: No  Medications: 5 mL lidocaine (PF) 1 %; 40 mg methylPREDNISolone acetate 40 MG/ML Outcome: tolerated well, no immediate complications Procedure, treatment alternatives, risks and benefits explained, specific risks discussed. Consent was given by the patient. Immediately prior to procedure a time out was called to verify the correct patient, procedure, equipment, support staff and site/side marked as required. Patient was prepped and draped in the usual sterile fashion.      Clinical Data: No additional findings.  ROS:  All other systems negative, except as noted in the HPI. Review of Systems  Objective: Vital Signs: There were no vitals taken for this visit.  Specialty Comments:  No specialty comments available.  PMFS History: There are no active problems to display for this patient.  Past Medical History:  Diagnosis Date   Protein in urine    has been cleared by Nephrologist    History reviewed. No pertinent family  history.  Past Surgical History:  Procedure Laterality Date   ANKLE FRACTURE SURGERY Left 2006- approx   KIDNEY SURGERY Left    "diseased part removed" age 62   ORIF TOE FRACTURE Left 06/10/2016   Procedure: Internal Fixation Left 5th Metatarsal;  Surgeon: Nadara Mustard, MD;  Location: [facility] OR;  Service: Orthopedics;  Laterality: Left;   SALPINGECTOMY Left    Social History   Occupational History   Not on file  Tobacco Use   Smoking status: Never   Smokeless tobacco: Not on file  Substance and Sexual Activity   Alcohol use: No   Drug use: No   Sexual activity: Not on file

## 2024-05-14 ENCOUNTER — Ambulatory Visit: Admitting: Orthopedic Surgery

## 2024-10-28 ENCOUNTER — Encounter: Payer: Self-pay | Admitting: Radiology

## 2025-05-27 ENCOUNTER — Ambulatory Visit: Admitting: Dermatology
